# Patient Record
Sex: Female | Born: 1945 | Race: White | Hispanic: No | Smoking: Never smoker
Health system: Southern US, Community
[De-identification: ages and names within clinical notes are randomized; demographics above are authoritative.]

---

## 1977-08-28 HISTORY — PX: TUBAL LIGATION: SHX77

## 2003-02-02 ENCOUNTER — Encounter: Payer: Self-pay | Admitting: Internal Medicine

## 2003-02-02 ENCOUNTER — Inpatient Hospital Stay (HOSPITAL_COMMUNITY): Admission: AD | Admit: 2003-02-02 | Discharge: 2003-02-04 | Payer: Self-pay | Admitting: Internal Medicine

## 2011-07-06 ENCOUNTER — Ambulatory Visit
Admission: RE | Admit: 2011-07-06 | Discharge: 2011-07-06 | Disposition: A | Discharge status: Home / Self Care | Payer: BC Managed Care – PPO | Source: Ambulatory Visit | Attending: Family Medicine | Admitting: Family Medicine

## 2011-07-06 ENCOUNTER — Other Ambulatory Visit: Payer: Self-pay | Admitting: Family Medicine

## 2011-07-06 DIAGNOSIS — R0602 Shortness of breath: Secondary | ICD-10-CM

## 2011-07-18 ENCOUNTER — Ambulatory Visit: Payer: Self-pay | Admitting: Internal Medicine

## 2011-08-04 ENCOUNTER — Encounter: Payer: Self-pay | Admitting: Pulmonary Disease

## 2011-08-07 ENCOUNTER — Ambulatory Visit (INDEPENDENT_AMBULATORY_CARE_PROVIDER_SITE_OTHER): Payer: BC Managed Care – PPO | Admitting: Pulmonary Disease

## 2011-08-07 ENCOUNTER — Encounter: Payer: Self-pay | Admitting: Pulmonary Disease

## 2011-08-07 VITALS — BP 138/86 | HR 81 | Temp 98.1°F | Ht 61.0 in | Wt 173.6 lb

## 2011-08-07 DIAGNOSIS — R0609 Other forms of dyspnea: Secondary | ICD-10-CM | POA: Insufficient documentation

## 2011-08-07 NOTE — Patient Instructions (Signed)
Continue your omeprazole for reflux in am and pm as your are doing, but add zantac 300mg  over the counter each night at bedtime until next visit. Get chlorpheniramine 8mg  over the counter and take each night at bedtime until next visit. Will schedule for breathing studies, and see you back same day for review.

## 2011-08-07 NOTE — Progress Notes (Signed)
  Subjective:    Patient ID: Stefanie Hanson, female    DOB: Jan 25, 1946, 65 y.o.   MRN: [id number]  HPI The patient is a 65 year old female who I've been asked to see for dyspnea on exertion and wheezing.  The patient notes worsening shortness of breath gradually over the last 6-8 months, and now is occurring with almost any activity.  She also notes increased issues with conversations.  The patient describes a 2 block dyspnea on exertion at a moderate pace on flat ground, and we'll also get winded walking up one flight of stairs and bringing groceries in from the car.  The patient states that she makes a strange noise from her throat with breathing.  She sometimes feels there is "something stuck there".  She also describes frequent audible wheezing.  She has a dry cough, but admits to having postnasal drip, and also significant reflux with breakthrough symptoms at night despite being on medications.  The patient has never smoked, and has no history of asthma.  She has no history of throat surgery, or history of prolonged endotracheal intubation.  She has had a recent chest x-ray that was clear.   Review of Systems  Constitutional: Negative for fever and unexpected weight change.  HENT: Positive for congestion. Negative for ear pain, nosebleeds, sore throat, rhinorrhea, sneezing, trouble swallowing, dental problem, postnasal drip and sinus pressure.   Eyes: Negative for redness and itching.  Respiratory: Positive for cough and shortness of breath. Negative for chest tightness and wheezing.   Cardiovascular: Negative for palpitations and leg swelling.  Gastrointestinal: Negative for nausea and vomiting.  Genitourinary: Negative for dysuria.  Musculoskeletal: Negative for joint swelling.  Skin: Negative for rash.  Neurological: Negative for headaches.  Hematological: Does not bruise/bleed easily.  Psychiatric/Behavioral: Negative for dysphoric mood. The patient is not nervous/anxious.          Objective:   Physical Exam Constitutional:  Well developed, no acute distress  HENT:  Nares patent without discharge, but narrowed esp on left  Oropharynx without exudate, palate and uvula are normal  Eyes:  Perrla, eomi, no scleral icterus  Neck:  No JVD, no TMG  Cardiovascular:  Normal rate, regular rhythm, no rubs or gallops.  No murmurs        Intact distal pulses  Pulmonary :  Normal breath sounds, prominent upper airway noise with inspiration and expiration   No rales, rhonchi, or wheezing  Abdominal:  Soft, nondistended, bowel sounds present.  No tenderness noted.   Musculoskeletal:  Minimal lower extremity edema noted, +varicosities  Lymph Nodes:  No cervical lymphadenopathy noted  Skin:  No cyanosis noted  Neurologic:  Alert, appropriate, moves all 4 extremities without obvious deficit.         Assessment & Plan:

## 2011-08-07 NOTE — Assessment & Plan Note (Signed)
The patient clearly has some type of upper airway issue, and I suspect this is the source of her "wheezing".  This may or may not be the cause of her dyspnea on exertion, depending upon whether it interferes with vocal cord excursion.  The most common cause for this type of upper airway issue is reflux disease or postnasal drip.  Would also consider whether she may have an anatomical issue leading to stenosis or vocal cord dysfunction.  I doubt this represents obstructive airways disease.  At this point, would like to treat her more aggressively for postnasal drip and reflux, and also schedule her for a full pulmonary function studies.  This will allow [place] to evaluate her for obstructive airways, but also to look at the flow volume loop to see if there is an anatomic issue.  She may need an ENT evaluation for an upper airway exam.

## 2011-09-01 ENCOUNTER — Ambulatory Visit: Payer: BC Managed Care – PPO | Admitting: Pulmonary Disease

## 2011-09-25 ENCOUNTER — Encounter: Payer: Self-pay | Admitting: Pulmonary Disease

## 2011-09-25 ENCOUNTER — Ambulatory Visit (INDEPENDENT_AMBULATORY_CARE_PROVIDER_SITE_OTHER): Payer: BC Managed Care – PPO | Admitting: Pulmonary Disease

## 2011-09-25 VITALS — BP 124/78 | HR 70 | Temp 98.0°F | Ht 61.0 in | Wt 164.0 lb

## 2011-09-25 DIAGNOSIS — R0609 Other forms of dyspnea: Secondary | ICD-10-CM

## 2011-09-25 LAB — PULMONARY FUNCTION TEST

## 2011-09-25 NOTE — Assessment & Plan Note (Signed)
The patient has a history and exam from last visit that was very suggestive of an upper airway process.  Her PFTs today showed very abnormal airflows, however her flow volume loop clearly suggest an upper airway obstruction.  Both the inspiratory and expiratory limbs were flattened, and suggestive of a fixed airway obstruction.  It is unclear whether this is an anatomic issue, or a functional vocal cord issue.  At this point, I would like to refer her to ENT for an upper airway exam.

## 2011-09-25 NOTE — Progress Notes (Signed)
PFT done today. 

## 2011-09-25 NOTE — Patient Instructions (Signed)
Your breathing tests suggest some type of obstruction in your upper airway.  Will send you to ENT for evaluation. If this is unremarkable, will discuss further options with you.

## 2011-09-25 NOTE — Progress Notes (Signed)
  Subjective:    Patient ID: Stefanie Hanson, female    DOB: 17-Mar-1946, 67 y.o.   MRN: [id number]  HPI Patient comes in today for followup of her pulmonary function studies, ordered as part of a workup for dyspnea.  At the last visit, there were concerns about an upper airway process, and she was treated with an antihistamine and proton pump inhibitor for possible sources of upper airway irritation.  The patient had pulmonary function studies today which showed abnormal flows, however the flow volume loop was very abnormal and suggestive of a fixed airflow obstruction.  I have reviewed this study with her in detail, and answered all of her questions.   Review of Systems  Constitutional: Negative for fever and unexpected weight change.  HENT: Negative for ear pain, nosebleeds, congestion, sore throat, rhinorrhea, sneezing, trouble swallowing, dental problem, postnasal drip and sinus pressure.   Eyes: Negative for redness and itching.  Respiratory: Positive for cough and wheezing. Negative for chest tightness and shortness of breath.   Cardiovascular: Negative for palpitations and leg swelling.  Gastrointestinal: Positive for nausea. Negative for vomiting.  Genitourinary: Negative for dysuria.  Musculoskeletal: Negative for joint swelling.  Skin: Negative for rash.  Neurological: Negative for headaches.  Hematological: Does not bruise/bleed easily.  Psychiatric/Behavioral: Negative for dysphoric mood. The patient is not nervous/anxious.        Objective:   Physical Exam Well-developed female in no acute distress Nose without purulence or discharge noted Chest with totally clear lung fields except for transmitted noise from the upper airway. Cardiac exam with regular rate and rhythm Alert and oriented, moves all 4 extremities.       Assessment & Plan:

## 2011-10-05 ENCOUNTER — Other Ambulatory Visit: Payer: Self-pay | Admitting: Otolaryngology

## 2011-10-05 DIAGNOSIS — R0989 Other specified symptoms and signs involving the circulatory and respiratory systems: Secondary | ICD-10-CM

## 2011-10-05 DIAGNOSIS — R0609 Other forms of dyspnea: Secondary | ICD-10-CM

## 2011-10-05 DIAGNOSIS — R490 Dysphonia: Secondary | ICD-10-CM

## 2011-10-06 ENCOUNTER — Ambulatory Visit
Admission: RE | Admit: 2011-10-06 | Discharge: 2011-10-06 | Disposition: A | Discharge status: Home / Self Care | Payer: BC Managed Care – PPO | Source: Ambulatory Visit | Attending: Otolaryngology | Admitting: Otolaryngology

## 2011-10-06 DIAGNOSIS — R490 Dysphonia: Secondary | ICD-10-CM

## 2011-10-06 DIAGNOSIS — R0989 Other specified symptoms and signs involving the circulatory and respiratory systems: Secondary | ICD-10-CM

## 2011-10-06 MED ORDER — IOHEXOL 300 MG/ML  SOLN: 75.0000 mL | Freq: Once | INTRAMUSCULAR | Status: AC | PRN

## 2012-07-16 DIAGNOSIS — H2510 Age-related nuclear cataract, unspecified eye: Secondary | ICD-10-CM | POA: Diagnosis not present

## 2012-07-16 DIAGNOSIS — H43819 Vitreous degeneration, unspecified eye: Secondary | ICD-10-CM | POA: Diagnosis not present

## 2012-07-16 DIAGNOSIS — H52209 Unspecified astigmatism, unspecified eye: Secondary | ICD-10-CM | POA: Diagnosis not present

## 2012-09-19 DIAGNOSIS — H43819 Vitreous degeneration, unspecified eye: Secondary | ICD-10-CM | POA: Diagnosis not present

## 2013-02-18 DIAGNOSIS — Z0000 Encounter for general adult medical examination without abnormal findings: Secondary | ICD-10-CM | POA: Diagnosis not present

## 2013-02-26 ENCOUNTER — Other Ambulatory Visit: Payer: Self-pay | Admitting: Family Medicine

## 2013-02-26 ENCOUNTER — Ambulatory Visit
Admission: RE | Admit: 2013-02-26 | Discharge: 2013-02-26 | Disposition: A | Discharge status: Home / Self Care | Payer: Medicare Other | Source: Ambulatory Visit | Attending: Family Medicine | Admitting: Family Medicine

## 2013-02-26 DIAGNOSIS — S66802A Unspecified injury of other specified muscles, fascia and tendons at wrist and hand level, left hand, initial encounter: Secondary | ICD-10-CM

## 2013-02-26 DIAGNOSIS — M898X9 Other specified disorders of bone, unspecified site: Secondary | ICD-10-CM

## 2013-02-26 DIAGNOSIS — M7989 Other specified soft tissue disorders: Secondary | ICD-10-CM | POA: Diagnosis not present

## 2013-03-20 DIAGNOSIS — M6740 Ganglion, unspecified site: Secondary | ICD-10-CM | POA: Diagnosis not present

## 2013-06-23 DIAGNOSIS — J386 Stenosis of larynx: Secondary | ICD-10-CM | POA: Diagnosis not present

## 2013-07-17 DIAGNOSIS — K219 Gastro-esophageal reflux disease without esophagitis: Secondary | ICD-10-CM | POA: Diagnosis not present

## 2013-07-17 DIAGNOSIS — J386 Stenosis of larynx: Secondary | ICD-10-CM | POA: Diagnosis not present

## 2013-08-11 DIAGNOSIS — J386 Stenosis of larynx: Secondary | ICD-10-CM | POA: Diagnosis not present

## 2013-08-11 DIAGNOSIS — K219 Gastro-esophageal reflux disease without esophagitis: Secondary | ICD-10-CM | POA: Diagnosis not present

## 2013-09-22 DIAGNOSIS — H43819 Vitreous degeneration, unspecified eye: Secondary | ICD-10-CM | POA: Diagnosis not present

## 2013-09-22 DIAGNOSIS — H40019 Open angle with borderline findings, low risk, unspecified eye: Secondary | ICD-10-CM | POA: Diagnosis not present

## 2013-09-22 DIAGNOSIS — H18519 Endothelial corneal dystrophy, unspecified eye: Secondary | ICD-10-CM | POA: Diagnosis not present

## 2013-09-22 DIAGNOSIS — H2510 Age-related nuclear cataract, unspecified eye: Secondary | ICD-10-CM | POA: Diagnosis not present

## 2014-07-28 DIAGNOSIS — Z1211 Encounter for screening for malignant neoplasm of colon: Secondary | ICD-10-CM | POA: Diagnosis not present

## 2014-07-28 DIAGNOSIS — Z0000 Encounter for general adult medical examination without abnormal findings: Secondary | ICD-10-CM | POA: Diagnosis not present

## 2014-10-23 DIAGNOSIS — H0012 Chalazion right lower eyelid: Secondary | ICD-10-CM | POA: Diagnosis not present

## 2014-10-23 DIAGNOSIS — H2513 Age-related nuclear cataract, bilateral: Secondary | ICD-10-CM | POA: Diagnosis not present

## 2014-10-23 DIAGNOSIS — H04123 Dry eye syndrome of bilateral lacrimal glands: Secondary | ICD-10-CM | POA: Diagnosis not present

## 2014-10-23 DIAGNOSIS — H43813 Vitreous degeneration, bilateral: Secondary | ICD-10-CM | POA: Diagnosis not present

## 2014-12-28 DIAGNOSIS — J398 Other specified diseases of upper respiratory tract: Secondary | ICD-10-CM | POA: Diagnosis not present

## 2014-12-28 DIAGNOSIS — Z9889 Other specified postprocedural states: Secondary | ICD-10-CM | POA: Diagnosis not present

## 2014-12-28 DIAGNOSIS — J383 Other diseases of vocal cords: Secondary | ICD-10-CM | POA: Diagnosis not present

## 2014-12-28 DIAGNOSIS — J386 Stenosis of larynx: Secondary | ICD-10-CM | POA: Diagnosis not present

## 2015-02-04 DIAGNOSIS — J398 Other specified diseases of upper respiratory tract: Secondary | ICD-10-CM | POA: Diagnosis not present

## 2015-02-04 DIAGNOSIS — K219 Gastro-esophageal reflux disease without esophagitis: Secondary | ICD-10-CM | POA: Diagnosis not present

## 2015-02-04 DIAGNOSIS — J386 Stenosis of larynx: Secondary | ICD-10-CM | POA: Diagnosis not present

## 2015-03-08 DIAGNOSIS — J383 Other diseases of vocal cords: Secondary | ICD-10-CM | POA: Diagnosis not present

## 2015-03-08 DIAGNOSIS — J384 Edema of larynx: Secondary | ICD-10-CM | POA: Diagnosis not present

## 2015-03-08 DIAGNOSIS — Z09 Encounter for follow-up examination after completed treatment for conditions other than malignant neoplasm: Secondary | ICD-10-CM | POA: Diagnosis not present

## 2015-03-08 DIAGNOSIS — R490 Dysphonia: Secondary | ICD-10-CM | POA: Diagnosis not present

## 2015-03-08 DIAGNOSIS — Z8709 Personal history of other diseases of the respiratory system: Secondary | ICD-10-CM | POA: Diagnosis not present

## 2015-03-12 DIAGNOSIS — J386 Stenosis of larynx: Secondary | ICD-10-CM | POA: Diagnosis not present

## 2015-05-19 DIAGNOSIS — Z13220 Encounter for screening for lipoid disorders: Secondary | ICD-10-CM | POA: Diagnosis not present

## 2015-05-19 DIAGNOSIS — Z136 Encounter for screening for cardiovascular disorders: Secondary | ICD-10-CM | POA: Diagnosis not present

## 2015-06-07 DIAGNOSIS — Z9889 Other specified postprocedural states: Secondary | ICD-10-CM | POA: Diagnosis not present

## 2015-06-07 DIAGNOSIS — J386 Stenosis of larynx: Secondary | ICD-10-CM | POA: Diagnosis not present

## 2015-06-07 DIAGNOSIS — R490 Dysphonia: Secondary | ICD-10-CM | POA: Diagnosis not present

## 2015-09-08 DIAGNOSIS — J386 Stenosis of larynx: Secondary | ICD-10-CM | POA: Diagnosis not present

## 2015-09-13 DIAGNOSIS — E785 Hyperlipidemia, unspecified: Secondary | ICD-10-CM | POA: Diagnosis not present

## 2015-10-26 DIAGNOSIS — H52203 Unspecified astigmatism, bilateral: Secondary | ICD-10-CM | POA: Diagnosis not present

## 2015-10-26 DIAGNOSIS — H43813 Vitreous degeneration, bilateral: Secondary | ICD-10-CM | POA: Diagnosis not present

## 2015-10-26 DIAGNOSIS — H2513 Age-related nuclear cataract, bilateral: Secondary | ICD-10-CM | POA: Diagnosis not present

## 2015-10-26 DIAGNOSIS — H524 Presbyopia: Secondary | ICD-10-CM | POA: Diagnosis not present

## 2016-04-04 DIAGNOSIS — L538 Other specified erythematous conditions: Secondary | ICD-10-CM | POA: Diagnosis not present

## 2016-04-04 DIAGNOSIS — J0410 Acute tracheitis without obstruction: Secondary | ICD-10-CM | POA: Diagnosis not present

## 2016-04-04 DIAGNOSIS — K219 Gastro-esophageal reflux disease without esophagitis: Secondary | ICD-10-CM | POA: Diagnosis not present

## 2016-04-04 DIAGNOSIS — J384 Edema of larynx: Secondary | ICD-10-CM | POA: Diagnosis not present

## 2016-04-04 DIAGNOSIS — J386 Stenosis of larynx: Secondary | ICD-10-CM | POA: Diagnosis not present

## 2016-04-04 DIAGNOSIS — J383 Other diseases of vocal cords: Secondary | ICD-10-CM | POA: Diagnosis not present

## 2016-04-04 DIAGNOSIS — R490 Dysphonia: Secondary | ICD-10-CM | POA: Diagnosis not present

## 2016-04-04 DIAGNOSIS — J398 Other specified diseases of upper respiratory tract: Secondary | ICD-10-CM | POA: Diagnosis not present

## 2016-05-04 ENCOUNTER — Other Ambulatory Visit: Payer: Self-pay

## 2016-07-06 DIAGNOSIS — K219 Gastro-esophageal reflux disease without esophagitis: Secondary | ICD-10-CM | POA: Diagnosis not present

## 2016-07-06 DIAGNOSIS — Z9851 Tubal ligation status: Secondary | ICD-10-CM | POA: Diagnosis not present

## 2016-07-06 DIAGNOSIS — J386 Stenosis of larynx: Secondary | ICD-10-CM | POA: Diagnosis not present

## 2016-10-25 DIAGNOSIS — Z1159 Encounter for screening for other viral diseases: Secondary | ICD-10-CM | POA: Diagnosis not present

## 2016-10-25 DIAGNOSIS — E785 Hyperlipidemia, unspecified: Secondary | ICD-10-CM | POA: Diagnosis not present

## 2016-10-25 DIAGNOSIS — Z0000 Encounter for general adult medical examination without abnormal findings: Secondary | ICD-10-CM | POA: Diagnosis not present

## 2016-10-27 DIAGNOSIS — H524 Presbyopia: Secondary | ICD-10-CM | POA: Diagnosis not present

## 2016-10-27 DIAGNOSIS — H2513 Age-related nuclear cataract, bilateral: Secondary | ICD-10-CM | POA: Diagnosis not present

## 2016-10-27 DIAGNOSIS — H01001 Unspecified blepharitis right upper eyelid: Secondary | ICD-10-CM | POA: Diagnosis not present

## 2016-10-27 DIAGNOSIS — H43813 Vitreous degeneration, bilateral: Secondary | ICD-10-CM | POA: Diagnosis not present

## 2017-02-06 DIAGNOSIS — K219 Gastro-esophageal reflux disease without esophagitis: Secondary | ICD-10-CM | POA: Diagnosis not present

## 2017-02-06 DIAGNOSIS — Z6834 Body mass index (BMI) 34.0-34.9, adult: Secondary | ICD-10-CM | POA: Diagnosis not present

## 2017-09-20 DIAGNOSIS — Z79899 Other long term (current) drug therapy: Secondary | ICD-10-CM | POA: Diagnosis not present

## 2017-09-20 DIAGNOSIS — J386 Stenosis of larynx: Secondary | ICD-10-CM | POA: Diagnosis not present

## 2017-09-20 DIAGNOSIS — K219 Gastro-esophageal reflux disease without esophagitis: Secondary | ICD-10-CM | POA: Diagnosis not present

## 2017-09-20 DIAGNOSIS — R0602 Shortness of breath: Secondary | ICD-10-CM | POA: Diagnosis not present

## 2017-09-20 DIAGNOSIS — Z9189 Other specified personal risk factors, not elsewhere classified: Secondary | ICD-10-CM | POA: Diagnosis not present

## 2017-09-20 DIAGNOSIS — J398 Other specified diseases of upper respiratory tract: Secondary | ICD-10-CM | POA: Diagnosis not present

## 2017-09-20 DIAGNOSIS — Z9889 Other specified postprocedural states: Secondary | ICD-10-CM | POA: Diagnosis not present

## 2017-10-23 DIAGNOSIS — J386 Stenosis of larynx: Secondary | ICD-10-CM | POA: Diagnosis not present

## 2017-10-23 DIAGNOSIS — J398 Other specified diseases of upper respiratory tract: Secondary | ICD-10-CM | POA: Diagnosis not present

## 2017-10-23 DIAGNOSIS — R490 Dysphonia: Secondary | ICD-10-CM | POA: Diagnosis not present

## 2017-10-26 DIAGNOSIS — Z1159 Encounter for screening for other viral diseases: Secondary | ICD-10-CM | POA: Diagnosis not present

## 2017-10-26 DIAGNOSIS — Z1211 Encounter for screening for malignant neoplasm of colon: Secondary | ICD-10-CM | POA: Diagnosis not present

## 2017-10-26 DIAGNOSIS — E785 Hyperlipidemia, unspecified: Secondary | ICD-10-CM | POA: Diagnosis not present

## 2017-10-26 DIAGNOSIS — Z0000 Encounter for general adult medical examination without abnormal findings: Secondary | ICD-10-CM | POA: Diagnosis not present

## 2017-10-26 DIAGNOSIS — L309 Dermatitis, unspecified: Secondary | ICD-10-CM | POA: Diagnosis not present

## 2017-10-29 DIAGNOSIS — H04123 Dry eye syndrome of bilateral lacrimal glands: Secondary | ICD-10-CM | POA: Diagnosis not present

## 2017-10-29 DIAGNOSIS — H52203 Unspecified astigmatism, bilateral: Secondary | ICD-10-CM | POA: Diagnosis not present

## 2017-10-29 DIAGNOSIS — H43813 Vitreous degeneration, bilateral: Secondary | ICD-10-CM | POA: Diagnosis not present

## 2017-10-29 DIAGNOSIS — H2513 Age-related nuclear cataract, bilateral: Secondary | ICD-10-CM | POA: Diagnosis not present

## 2018-02-13 DIAGNOSIS — L218 Other seborrheic dermatitis: Secondary | ICD-10-CM | POA: Diagnosis not present

## 2018-02-13 DIAGNOSIS — L84 Corns and callosities: Secondary | ICD-10-CM | POA: Diagnosis not present

## 2018-02-13 DIAGNOSIS — D485 Neoplasm of uncertain behavior of skin: Secondary | ICD-10-CM | POA: Diagnosis not present

## 2018-02-13 DIAGNOSIS — L821 Other seborrheic keratosis: Secondary | ICD-10-CM | POA: Diagnosis not present

## 2018-02-13 DIAGNOSIS — L989 Disorder of the skin and subcutaneous tissue, unspecified: Secondary | ICD-10-CM | POA: Diagnosis not present

## 2018-05-03 DIAGNOSIS — J386 Stenosis of larynx: Secondary | ICD-10-CM | POA: Diagnosis not present

## 2018-05-03 DIAGNOSIS — R634 Abnormal weight loss: Secondary | ICD-10-CM | POA: Diagnosis not present

## 2018-05-03 DIAGNOSIS — E785 Hyperlipidemia, unspecified: Secondary | ICD-10-CM | POA: Diagnosis not present

## 2018-09-11 DIAGNOSIS — J386 Stenosis of larynx: Secondary | ICD-10-CM | POA: Diagnosis not present

## 2018-09-11 DIAGNOSIS — F430 Acute stress reaction: Secondary | ICD-10-CM | POA: Diagnosis not present

## 2018-10-03 DIAGNOSIS — J398 Other specified diseases of upper respiratory tract: Secondary | ICD-10-CM | POA: Diagnosis not present

## 2018-10-03 DIAGNOSIS — J386 Stenosis of larynx: Secondary | ICD-10-CM | POA: Diagnosis not present

## 2018-10-30 DIAGNOSIS — E785 Hyperlipidemia, unspecified: Secondary | ICD-10-CM | POA: Diagnosis not present

## 2018-10-30 DIAGNOSIS — Z0000 Encounter for general adult medical examination without abnormal findings: Secondary | ICD-10-CM | POA: Diagnosis not present

## 2018-10-30 DIAGNOSIS — J386 Stenosis of larynx: Secondary | ICD-10-CM | POA: Diagnosis not present

## 2018-10-30 DIAGNOSIS — H9190 Unspecified hearing loss, unspecified ear: Secondary | ICD-10-CM | POA: Diagnosis not present

## 2018-11-01 DIAGNOSIS — H2513 Age-related nuclear cataract, bilateral: Secondary | ICD-10-CM | POA: Diagnosis not present

## 2018-11-01 DIAGNOSIS — H52203 Unspecified astigmatism, bilateral: Secondary | ICD-10-CM | POA: Diagnosis not present

## 2018-11-01 DIAGNOSIS — H1851 Endothelial corneal dystrophy: Secondary | ICD-10-CM | POA: Diagnosis not present

## 2018-11-01 DIAGNOSIS — H43813 Vitreous degeneration, bilateral: Secondary | ICD-10-CM | POA: Diagnosis not present

## 2019-03-31 DIAGNOSIS — H903 Sensorineural hearing loss, bilateral: Secondary | ICD-10-CM | POA: Diagnosis not present

## 2019-03-31 DIAGNOSIS — H9313 Tinnitus, bilateral: Secondary | ICD-10-CM | POA: Diagnosis not present

## 2019-04-01 DIAGNOSIS — L659 Nonscarring hair loss, unspecified: Secondary | ICD-10-CM | POA: Diagnosis not present

## 2019-04-01 DIAGNOSIS — E785 Hyperlipidemia, unspecified: Secondary | ICD-10-CM | POA: Diagnosis not present

## 2019-04-01 DIAGNOSIS — F439 Reaction to severe stress, unspecified: Secondary | ICD-10-CM | POA: Diagnosis not present

## 2019-04-14 DIAGNOSIS — J398 Other specified diseases of upper respiratory tract: Secondary | ICD-10-CM | POA: Diagnosis not present

## 2019-04-14 DIAGNOSIS — J386 Stenosis of larynx: Secondary | ICD-10-CM | POA: Diagnosis not present

## 2019-04-28 DIAGNOSIS — F411 Generalized anxiety disorder: Secondary | ICD-10-CM | POA: Diagnosis not present

## 2019-04-28 DIAGNOSIS — F331 Major depressive disorder, recurrent, moderate: Secondary | ICD-10-CM | POA: Diagnosis not present

## 2019-05-08 DIAGNOSIS — F331 Major depressive disorder, recurrent, moderate: Secondary | ICD-10-CM | POA: Diagnosis not present

## 2019-05-08 DIAGNOSIS — F411 Generalized anxiety disorder: Secondary | ICD-10-CM | POA: Diagnosis not present

## 2019-05-22 DIAGNOSIS — F411 Generalized anxiety disorder: Secondary | ICD-10-CM | POA: Diagnosis not present

## 2019-05-22 DIAGNOSIS — F331 Major depressive disorder, recurrent, moderate: Secondary | ICD-10-CM | POA: Diagnosis not present

## 2019-05-26 DIAGNOSIS — Z9889 Other specified postprocedural states: Secondary | ICD-10-CM | POA: Diagnosis not present

## 2019-05-26 DIAGNOSIS — J386 Stenosis of larynx: Secondary | ICD-10-CM | POA: Diagnosis not present

## 2019-06-26 DIAGNOSIS — F331 Major depressive disorder, recurrent, moderate: Secondary | ICD-10-CM | POA: Diagnosis not present

## 2019-06-26 DIAGNOSIS — F411 Generalized anxiety disorder: Secondary | ICD-10-CM | POA: Diagnosis not present

## 2019-09-01 DIAGNOSIS — J398 Other specified diseases of upper respiratory tract: Secondary | ICD-10-CM | POA: Diagnosis not present

## 2019-09-01 DIAGNOSIS — J386 Stenosis of larynx: Secondary | ICD-10-CM | POA: Diagnosis not present

## 2019-10-23 DIAGNOSIS — F411 Generalized anxiety disorder: Secondary | ICD-10-CM | POA: Diagnosis not present

## 2019-10-23 DIAGNOSIS — F331 Major depressive disorder, recurrent, moderate: Secondary | ICD-10-CM | POA: Diagnosis not present

## 2019-11-04 DIAGNOSIS — Z1211 Encounter for screening for malignant neoplasm of colon: Secondary | ICD-10-CM | POA: Diagnosis not present

## 2019-11-04 DIAGNOSIS — H9190 Unspecified hearing loss, unspecified ear: Secondary | ICD-10-CM | POA: Diagnosis not present

## 2019-11-04 DIAGNOSIS — J386 Stenosis of larynx: Secondary | ICD-10-CM | POA: Diagnosis not present

## 2019-11-04 DIAGNOSIS — Z0000 Encounter for general adult medical examination without abnormal findings: Secondary | ICD-10-CM | POA: Diagnosis not present

## 2019-11-04 DIAGNOSIS — E785 Hyperlipidemia, unspecified: Secondary | ICD-10-CM | POA: Diagnosis not present

## 2019-11-07 DIAGNOSIS — Z1211 Encounter for screening for malignant neoplasm of colon: Secondary | ICD-10-CM | POA: Diagnosis not present

## 2019-11-13 DIAGNOSIS — F331 Major depressive disorder, recurrent, moderate: Secondary | ICD-10-CM | POA: Diagnosis not present

## 2019-11-13 DIAGNOSIS — F411 Generalized anxiety disorder: Secondary | ICD-10-CM | POA: Diagnosis not present

## 2019-11-27 DIAGNOSIS — F411 Generalized anxiety disorder: Secondary | ICD-10-CM | POA: Diagnosis not present

## 2019-11-27 DIAGNOSIS — F331 Major depressive disorder, recurrent, moderate: Secondary | ICD-10-CM | POA: Diagnosis not present

## 2019-12-10 DIAGNOSIS — F331 Major depressive disorder, recurrent, moderate: Secondary | ICD-10-CM | POA: Diagnosis not present

## 2019-12-10 DIAGNOSIS — F411 Generalized anxiety disorder: Secondary | ICD-10-CM | POA: Diagnosis not present

## 2019-12-17 DIAGNOSIS — H524 Presbyopia: Secondary | ICD-10-CM | POA: Diagnosis not present

## 2019-12-17 DIAGNOSIS — H2513 Age-related nuclear cataract, bilateral: Secondary | ICD-10-CM | POA: Diagnosis not present

## 2019-12-17 DIAGNOSIS — H18513 Endothelial corneal dystrophy, bilateral: Secondary | ICD-10-CM | POA: Diagnosis not present

## 2019-12-17 DIAGNOSIS — H52203 Unspecified astigmatism, bilateral: Secondary | ICD-10-CM | POA: Diagnosis not present

## 2019-12-24 DIAGNOSIS — F411 Generalized anxiety disorder: Secondary | ICD-10-CM | POA: Diagnosis not present

## 2019-12-24 DIAGNOSIS — F331 Major depressive disorder, recurrent, moderate: Secondary | ICD-10-CM | POA: Diagnosis not present

## 2019-12-29 DIAGNOSIS — J386 Stenosis of larynx: Secondary | ICD-10-CM | POA: Diagnosis not present

## 2020-01-07 DIAGNOSIS — F411 Generalized anxiety disorder: Secondary | ICD-10-CM | POA: Diagnosis not present

## 2020-01-07 DIAGNOSIS — F331 Major depressive disorder, recurrent, moderate: Secondary | ICD-10-CM | POA: Diagnosis not present

## 2020-01-22 DIAGNOSIS — F331 Major depressive disorder, recurrent, moderate: Secondary | ICD-10-CM | POA: Diagnosis not present

## 2020-01-22 DIAGNOSIS — F411 Generalized anxiety disorder: Secondary | ICD-10-CM | POA: Diagnosis not present

## 2020-02-04 DIAGNOSIS — E785 Hyperlipidemia, unspecified: Secondary | ICD-10-CM | POA: Diagnosis not present

## 2020-02-05 DIAGNOSIS — F331 Major depressive disorder, recurrent, moderate: Secondary | ICD-10-CM | POA: Diagnosis not present

## 2020-02-05 DIAGNOSIS — F411 Generalized anxiety disorder: Secondary | ICD-10-CM | POA: Diagnosis not present

## 2020-03-18 DIAGNOSIS — F331 Major depressive disorder, recurrent, moderate: Secondary | ICD-10-CM | POA: Diagnosis not present

## 2020-03-18 DIAGNOSIS — F411 Generalized anxiety disorder: Secondary | ICD-10-CM | POA: Diagnosis not present

## 2020-04-01 DIAGNOSIS — F411 Generalized anxiety disorder: Secondary | ICD-10-CM | POA: Diagnosis not present

## 2020-04-01 DIAGNOSIS — F331 Major depressive disorder, recurrent, moderate: Secondary | ICD-10-CM | POA: Diagnosis not present

## 2020-04-15 DIAGNOSIS — F331 Major depressive disorder, recurrent, moderate: Secondary | ICD-10-CM | POA: Diagnosis not present

## 2020-04-15 DIAGNOSIS — F411 Generalized anxiety disorder: Secondary | ICD-10-CM | POA: Diagnosis not present

## 2020-04-26 DIAGNOSIS — J386 Stenosis of larynx: Secondary | ICD-10-CM | POA: Diagnosis not present

## 2020-04-26 DIAGNOSIS — J398 Other specified diseases of upper respiratory tract: Secondary | ICD-10-CM | POA: Diagnosis not present

## 2020-04-29 DIAGNOSIS — F331 Major depressive disorder, recurrent, moderate: Secondary | ICD-10-CM | POA: Diagnosis not present

## 2020-04-29 DIAGNOSIS — F411 Generalized anxiety disorder: Secondary | ICD-10-CM | POA: Diagnosis not present

## 2020-05-13 DIAGNOSIS — F411 Generalized anxiety disorder: Secondary | ICD-10-CM | POA: Diagnosis not present

## 2020-05-13 DIAGNOSIS — F331 Major depressive disorder, recurrent, moderate: Secondary | ICD-10-CM | POA: Diagnosis not present

## 2020-05-27 DIAGNOSIS — F331 Major depressive disorder, recurrent, moderate: Secondary | ICD-10-CM | POA: Diagnosis not present

## 2020-05-27 DIAGNOSIS — F411 Generalized anxiety disorder: Secondary | ICD-10-CM | POA: Diagnosis not present

## 2020-06-10 DIAGNOSIS — F411 Generalized anxiety disorder: Secondary | ICD-10-CM | POA: Diagnosis not present

## 2020-06-10 DIAGNOSIS — F331 Major depressive disorder, recurrent, moderate: Secondary | ICD-10-CM | POA: Diagnosis not present

## 2020-07-01 DIAGNOSIS — F331 Major depressive disorder, recurrent, moderate: Secondary | ICD-10-CM | POA: Diagnosis not present

## 2020-07-01 DIAGNOSIS — F411 Generalized anxiety disorder: Secondary | ICD-10-CM | POA: Diagnosis not present

## 2020-07-15 DIAGNOSIS — F331 Major depressive disorder, recurrent, moderate: Secondary | ICD-10-CM | POA: Diagnosis not present

## 2020-07-15 DIAGNOSIS — F411 Generalized anxiety disorder: Secondary | ICD-10-CM | POA: Diagnosis not present

## 2020-07-29 DIAGNOSIS — F331 Major depressive disorder, recurrent, moderate: Secondary | ICD-10-CM | POA: Diagnosis not present

## 2020-07-29 DIAGNOSIS — F411 Generalized anxiety disorder: Secondary | ICD-10-CM | POA: Diagnosis not present

## 2020-08-04 DIAGNOSIS — Z23 Encounter for immunization: Secondary | ICD-10-CM | POA: Diagnosis not present

## 2020-09-14 ENCOUNTER — Other Ambulatory Visit: Payer: Self-pay | Admitting: Family Medicine

## 2020-09-14 DIAGNOSIS — M7989 Other specified soft tissue disorders: Secondary | ICD-10-CM

## 2020-10-06 DIAGNOSIS — J386 Stenosis of larynx: Secondary | ICD-10-CM | POA: Diagnosis not present

## 2020-10-06 DIAGNOSIS — J398 Other specified diseases of upper respiratory tract: Secondary | ICD-10-CM | POA: Diagnosis not present

## 2020-10-07 DIAGNOSIS — F331 Major depressive disorder, recurrent, moderate: Secondary | ICD-10-CM | POA: Diagnosis not present

## 2020-10-07 DIAGNOSIS — F411 Generalized anxiety disorder: Secondary | ICD-10-CM | POA: Diagnosis not present

## 2020-10-28 ENCOUNTER — Ambulatory Visit
Admission: RE | Admit: 2020-10-28 | Discharge: 2020-10-28 | Disposition: A | Discharge status: Home / Self Care | Payer: Medicare Other | Source: Ambulatory Visit | Attending: Family Medicine | Admitting: Family Medicine

## 2020-10-28 DIAGNOSIS — F411 Generalized anxiety disorder: Secondary | ICD-10-CM | POA: Diagnosis not present

## 2020-10-28 DIAGNOSIS — F331 Major depressive disorder, recurrent, moderate: Secondary | ICD-10-CM | POA: Diagnosis not present

## 2020-10-28 DIAGNOSIS — M7989 Other specified soft tissue disorders: Secondary | ICD-10-CM

## 2020-11-18 DIAGNOSIS — F411 Generalized anxiety disorder: Secondary | ICD-10-CM | POA: Diagnosis not present

## 2020-11-18 DIAGNOSIS — F331 Major depressive disorder, recurrent, moderate: Secondary | ICD-10-CM | POA: Diagnosis not present

## 2020-12-02 DIAGNOSIS — H9193 Unspecified hearing loss, bilateral: Secondary | ICD-10-CM | POA: Diagnosis not present

## 2020-12-02 DIAGNOSIS — M25562 Pain in left knee: Secondary | ICD-10-CM | POA: Diagnosis not present

## 2020-12-02 DIAGNOSIS — Z0000 Encounter for general adult medical examination without abnormal findings: Secondary | ICD-10-CM | POA: Diagnosis not present

## 2020-12-02 DIAGNOSIS — E785 Hyperlipidemia, unspecified: Secondary | ICD-10-CM | POA: Diagnosis not present

## 2020-12-02 DIAGNOSIS — F320 Major depressive disorder, single episode, mild: Secondary | ICD-10-CM | POA: Diagnosis not present

## 2020-12-02 DIAGNOSIS — J386 Stenosis of larynx: Secondary | ICD-10-CM | POA: Diagnosis not present

## 2020-12-09 DIAGNOSIS — F331 Major depressive disorder, recurrent, moderate: Secondary | ICD-10-CM | POA: Diagnosis not present

## 2020-12-09 DIAGNOSIS — F411 Generalized anxiety disorder: Secondary | ICD-10-CM | POA: Diagnosis not present

## 2020-12-22 DIAGNOSIS — H524 Presbyopia: Secondary | ICD-10-CM | POA: Diagnosis not present

## 2020-12-22 DIAGNOSIS — H2513 Age-related nuclear cataract, bilateral: Secondary | ICD-10-CM | POA: Diagnosis not present

## 2020-12-22 DIAGNOSIS — H18513 Endothelial corneal dystrophy, bilateral: Secondary | ICD-10-CM | POA: Diagnosis not present

## 2020-12-22 DIAGNOSIS — H25013 Cortical age-related cataract, bilateral: Secondary | ICD-10-CM | POA: Diagnosis not present

## 2020-12-30 DIAGNOSIS — F411 Generalized anxiety disorder: Secondary | ICD-10-CM | POA: Diagnosis not present

## 2020-12-30 DIAGNOSIS — F331 Major depressive disorder, recurrent, moderate: Secondary | ICD-10-CM | POA: Diagnosis not present

## 2021-01-06 DIAGNOSIS — J398 Other specified diseases of upper respiratory tract: Secondary | ICD-10-CM | POA: Diagnosis not present

## 2021-01-20 DIAGNOSIS — F331 Major depressive disorder, recurrent, moderate: Secondary | ICD-10-CM | POA: Diagnosis not present

## 2021-01-20 DIAGNOSIS — F411 Generalized anxiety disorder: Secondary | ICD-10-CM | POA: Diagnosis not present

## 2021-02-01 DIAGNOSIS — J386 Stenosis of larynx: Secondary | ICD-10-CM | POA: Diagnosis not present

## 2021-02-02 DIAGNOSIS — F411 Generalized anxiety disorder: Secondary | ICD-10-CM | POA: Diagnosis not present

## 2021-02-02 DIAGNOSIS — F331 Major depressive disorder, recurrent, moderate: Secondary | ICD-10-CM | POA: Diagnosis not present

## 2021-02-24 DIAGNOSIS — F411 Generalized anxiety disorder: Secondary | ICD-10-CM | POA: Diagnosis not present

## 2021-02-24 DIAGNOSIS — F331 Major depressive disorder, recurrent, moderate: Secondary | ICD-10-CM | POA: Diagnosis not present

## 2021-03-17 DIAGNOSIS — F411 Generalized anxiety disorder: Secondary | ICD-10-CM | POA: Diagnosis not present

## 2021-03-17 DIAGNOSIS — F331 Major depressive disorder, recurrent, moderate: Secondary | ICD-10-CM | POA: Diagnosis not present

## 2021-03-21 DIAGNOSIS — J398 Other specified diseases of upper respiratory tract: Secondary | ICD-10-CM | POA: Diagnosis not present

## 2021-03-21 DIAGNOSIS — J386 Stenosis of larynx: Secondary | ICD-10-CM | POA: Diagnosis not present

## 2021-04-07 DIAGNOSIS — F331 Major depressive disorder, recurrent, moderate: Secondary | ICD-10-CM | POA: Diagnosis not present

## 2021-04-07 DIAGNOSIS — F411 Generalized anxiety disorder: Secondary | ICD-10-CM | POA: Diagnosis not present

## 2021-04-28 DIAGNOSIS — F411 Generalized anxiety disorder: Secondary | ICD-10-CM | POA: Diagnosis not present

## 2021-04-28 DIAGNOSIS — F331 Major depressive disorder, recurrent, moderate: Secondary | ICD-10-CM | POA: Diagnosis not present

## 2021-05-04 DIAGNOSIS — J386 Stenosis of larynx: Secondary | ICD-10-CM | POA: Diagnosis not present

## 2021-05-04 DIAGNOSIS — J398 Other specified diseases of upper respiratory tract: Secondary | ICD-10-CM | POA: Diagnosis not present

## 2021-05-19 DIAGNOSIS — F411 Generalized anxiety disorder: Secondary | ICD-10-CM | POA: Diagnosis not present

## 2021-05-19 DIAGNOSIS — F331 Major depressive disorder, recurrent, moderate: Secondary | ICD-10-CM | POA: Diagnosis not present

## 2021-06-23 DIAGNOSIS — F331 Major depressive disorder, recurrent, moderate: Secondary | ICD-10-CM | POA: Diagnosis not present

## 2021-06-23 DIAGNOSIS — F411 Generalized anxiety disorder: Secondary | ICD-10-CM | POA: Diagnosis not present

## 2021-07-04 DIAGNOSIS — J386 Stenosis of larynx: Secondary | ICD-10-CM | POA: Diagnosis not present

## 2021-09-15 IMAGING — US US EXTREM LOW*L* LIMITED
1 series · 14 of 15 positions shown · non-contrast
Comparison: None.

CLINICAL DATA: Leg swelling

EXAM:
ULTRASOUND left LOWER EXTREMITY LIMITED
TECHNIQUE: Ultrasound examination of the lower extremity soft tissues was
performed in the area of clinical concern.

[Series 1: us extrem low*left* limited · 0.07mm/px · 15 acquisitions, 14 frames shown]
[im 1/15]
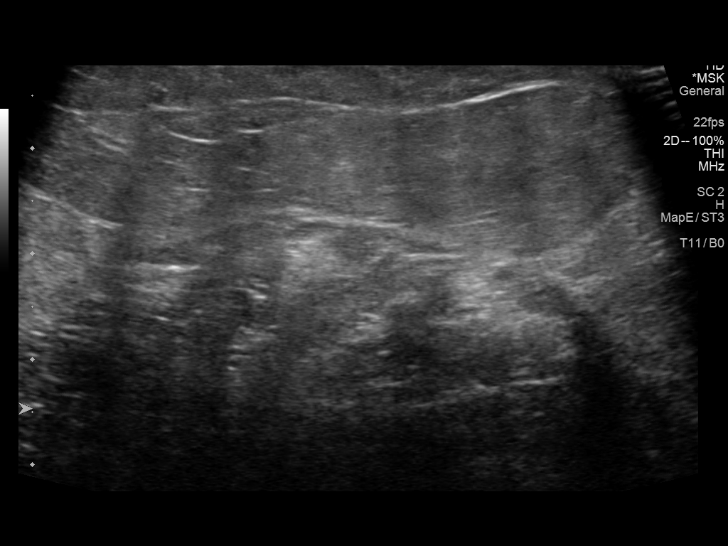
[im 2/15]
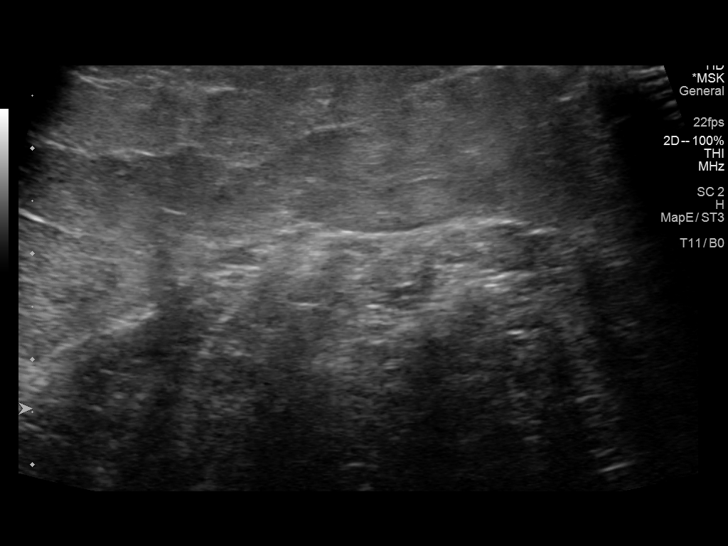
[im 3/15]
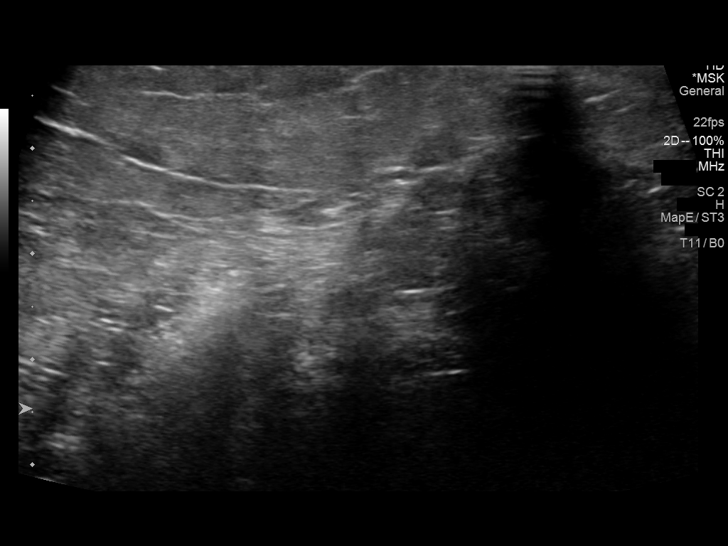
[im 4/15]
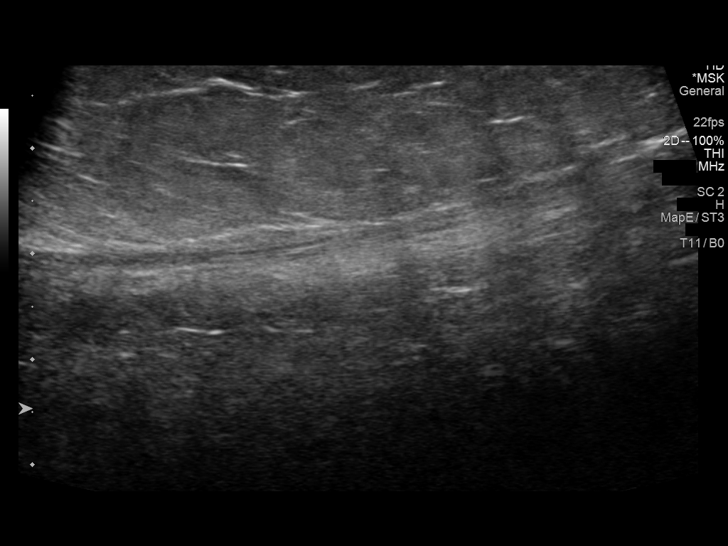
[im 5/15]
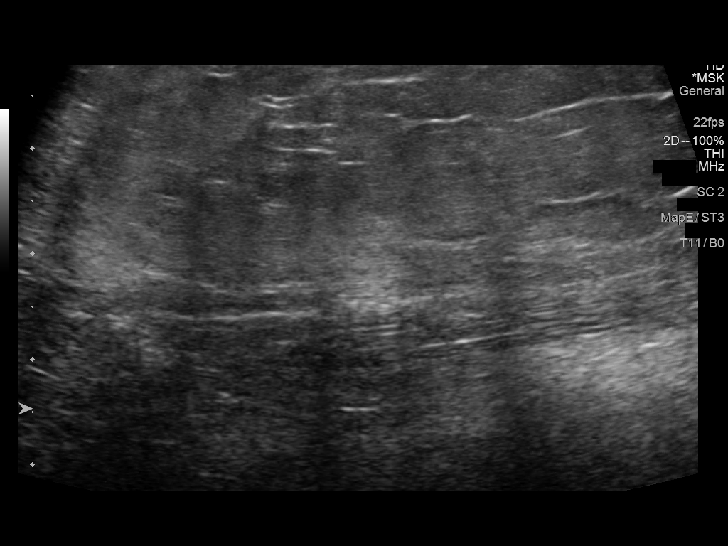
[im 6/15]
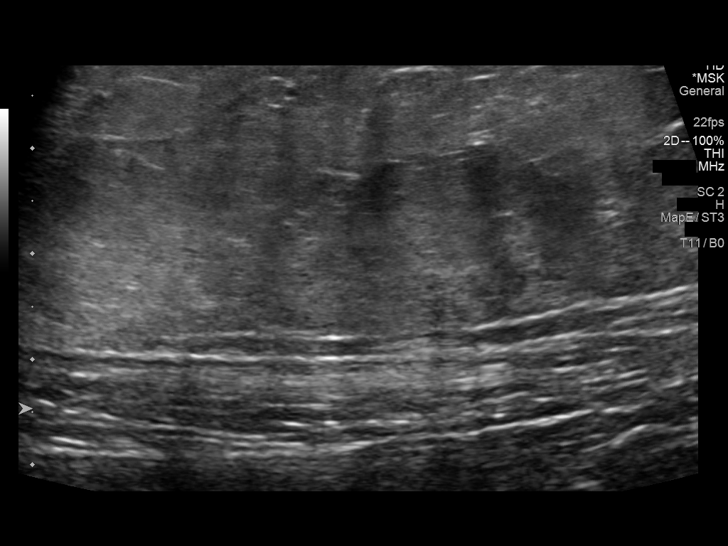
[im 7/15]
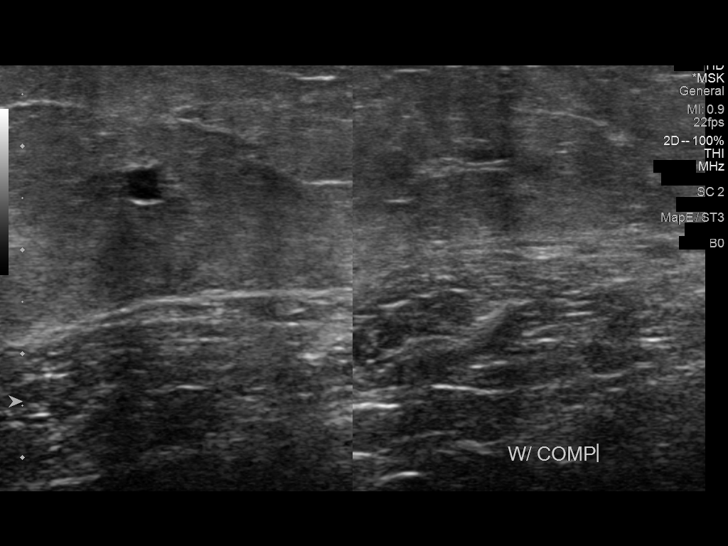
[im 9/15]
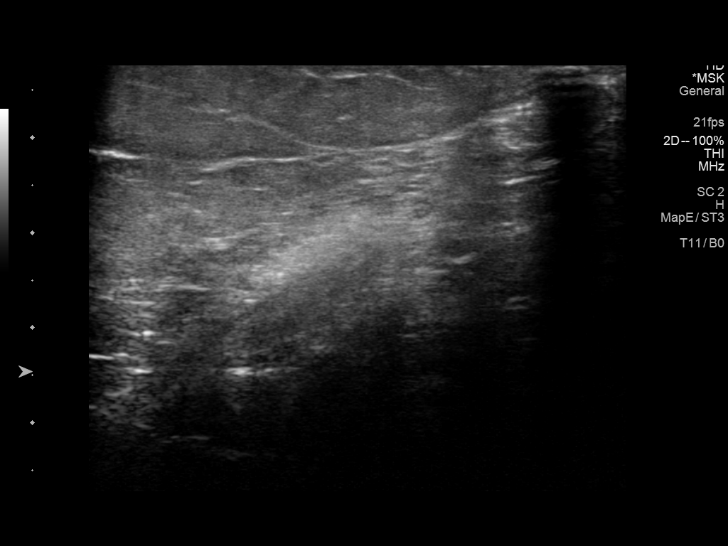
[im 10/15]
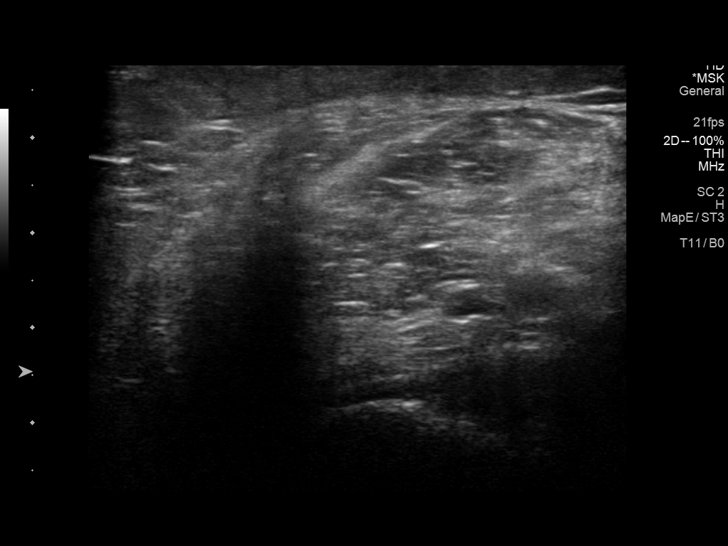
[im 11/15]
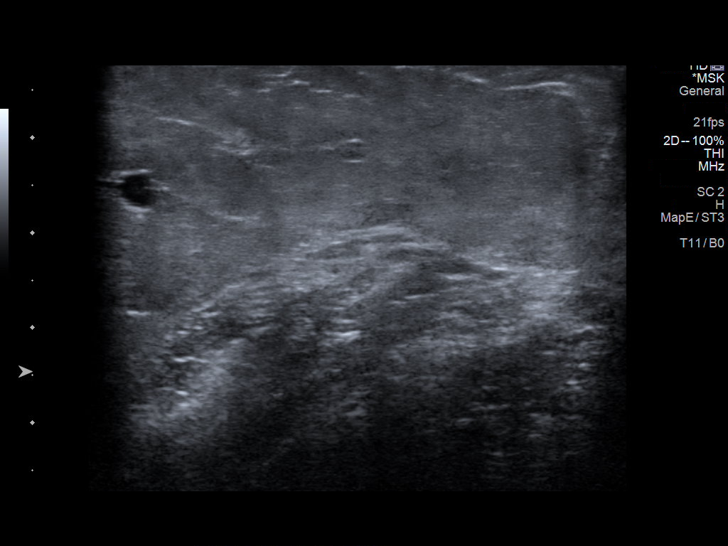
[im 12/15]
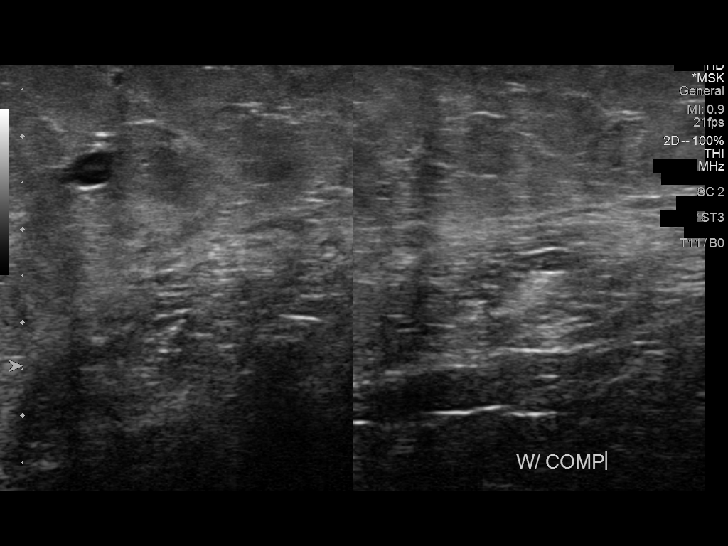
[im 13/15]
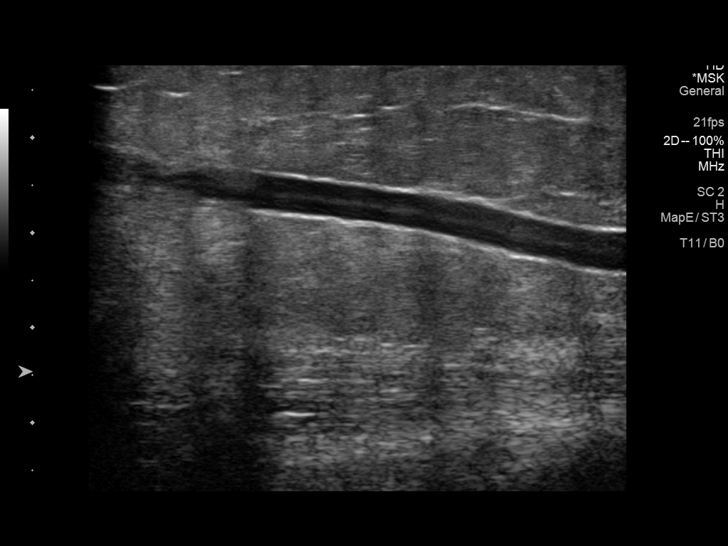
[im 14/15]
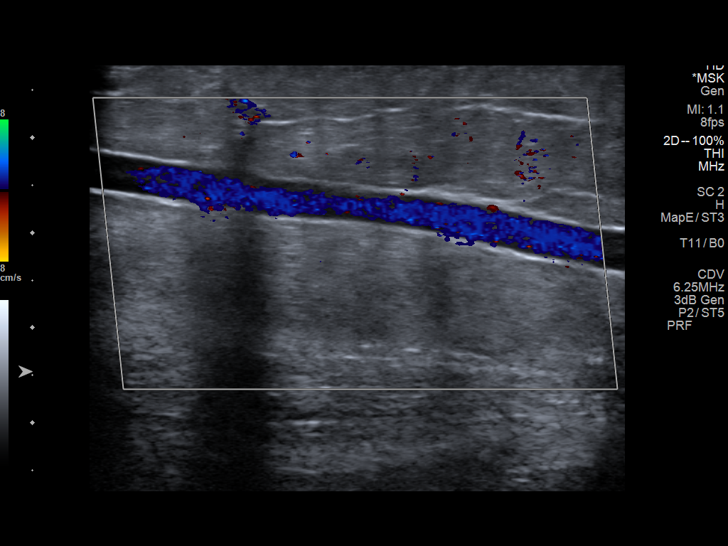
[im 15/15]
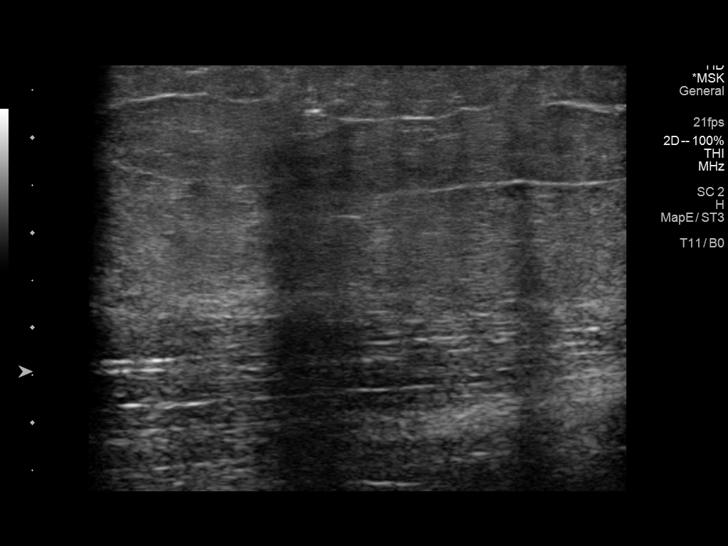

[14 of 15 positions shown; findings below may reference images not displayed]

FINDINGS: Targeted ultrasound of the area of swelling is performed. This is
designated as the left distal medial thigh. No cystic or solid mass
is visualized.
IMPRESSION: Negative. No sonographic correlate for left distal thigh swelling,
further management will need to be based on clinical grounds

## 2021-10-04 DIAGNOSIS — J386 Stenosis of larynx: Secondary | ICD-10-CM | POA: Diagnosis not present

## 2021-12-23 DIAGNOSIS — H43813 Vitreous degeneration, bilateral: Secondary | ICD-10-CM | POA: Diagnosis not present

## 2021-12-23 DIAGNOSIS — H18513 Endothelial corneal dystrophy, bilateral: Secondary | ICD-10-CM | POA: Diagnosis not present

## 2021-12-23 DIAGNOSIS — H5212 Myopia, left eye: Secondary | ICD-10-CM | POA: Diagnosis not present

## 2021-12-23 DIAGNOSIS — H2513 Age-related nuclear cataract, bilateral: Secondary | ICD-10-CM | POA: Diagnosis not present

## 2021-12-27 DIAGNOSIS — J386 Stenosis of larynx: Secondary | ICD-10-CM | POA: Diagnosis not present

## 2021-12-27 DIAGNOSIS — M25569 Pain in unspecified knee: Secondary | ICD-10-CM | POA: Diagnosis not present

## 2021-12-27 DIAGNOSIS — I8390 Asymptomatic varicose veins of unspecified lower extremity: Secondary | ICD-10-CM | POA: Diagnosis not present

## 2021-12-27 DIAGNOSIS — Z0000 Encounter for general adult medical examination without abnormal findings: Secondary | ICD-10-CM | POA: Diagnosis not present

## 2021-12-27 DIAGNOSIS — E785 Hyperlipidemia, unspecified: Secondary | ICD-10-CM | POA: Diagnosis not present

## 2022-01-11 DIAGNOSIS — M25561 Pain in right knee: Secondary | ICD-10-CM | POA: Diagnosis not present

## 2022-01-11 DIAGNOSIS — M25562 Pain in left knee: Secondary | ICD-10-CM | POA: Diagnosis not present

## 2022-01-18 ENCOUNTER — Other Ambulatory Visit: Payer: Self-pay | Admitting: Sports Medicine

## 2022-01-18 ENCOUNTER — Ambulatory Visit
Admission: RE | Admit: 2022-01-18 | Discharge: 2022-01-18 | Disposition: A | Discharge status: Home / Self Care | Payer: Medicare Other | Source: Ambulatory Visit | Attending: Sports Medicine | Admitting: Sports Medicine

## 2022-01-18 DIAGNOSIS — M25562 Pain in left knee: Secondary | ICD-10-CM

## 2022-01-18 DIAGNOSIS — M25561 Pain in right knee: Secondary | ICD-10-CM

## 2022-01-18 DIAGNOSIS — M1712 Unilateral primary osteoarthritis, left knee: Secondary | ICD-10-CM | POA: Diagnosis not present

## 2022-01-18 DIAGNOSIS — M1711 Unilateral primary osteoarthritis, right knee: Secondary | ICD-10-CM | POA: Diagnosis not present

## 2022-02-01 DIAGNOSIS — J398 Other specified diseases of upper respiratory tract: Secondary | ICD-10-CM | POA: Diagnosis not present

## 2022-02-01 DIAGNOSIS — J386 Stenosis of larynx: Secondary | ICD-10-CM | POA: Diagnosis not present

## 2022-04-12 ENCOUNTER — Ambulatory Visit (INDEPENDENT_AMBULATORY_CARE_PROVIDER_SITE_OTHER): Payer: Medicare Other | Admitting: Internal Medicine

## 2022-04-12 ENCOUNTER — Encounter: Payer: Self-pay | Admitting: Internal Medicine

## 2022-04-12 VITALS — BP 138/83 | HR 74 | Ht 60.0 in | Wt 176.0 lb

## 2022-04-12 DIAGNOSIS — E785 Hyperlipidemia, unspecified: Secondary | ICD-10-CM

## 2022-04-12 MED ORDER — EZETIMIBE 10 MG PO TABS: 10.0000 mg | ORAL_TABLET | Freq: Every day | ORAL | 3 refills | Status: AC

## 2022-04-12 NOTE — Progress Notes (Signed)
LIPID CLINIC CONSULT NOTE  Chief Complaint:  Manage dyslipidemia  Primary Care Physician: London Pepper, MD  Primary Cardiologist:  None  HPI:  Stefanie Hanson is a 76 y.o. female who is being seen today for the evaluation of dyslipidemia at the request of London Pepper, MD. this is a pleasant 76 year old female kindly referred for evaluation management of dyslipidemia.  She has a history of high cholesterol and recently had lipid testing in May 2023 showing total cholesterol 271, HDL 63, triglycerides 178 and LDL 176.  She had previously been on rosuvastatin but had side effects which she felt were related to that, namely she had hair loss.  She notes that after stopping it she had improvement in regrowth of her hair several months later.  She has been hesitant to try another statin.  She is not currently taking any medications.  She feels that if she were to lose weight that it would help her cholesterol although she has had difficulty with this over many years.  Her target LDL probably is less than 100 given few cardiovascular risk factors.  She denies any chest pain or worsening shortness of breath.  She did note that her father died of heart disease however he was 26.  PMHx:  No past medical history on file.  Past Surgical History:  Procedure Laterality Date   TUBAL LIGATION  1979    FAMHx:  Family History  Problem Relation Age of Onset   Coronary artery disease Father    Other Mother        intestinal blockage   Lung cancer Brother    Hypertension Sister    Hypertension Sister    Heart attack Father     SOCHx:   reports that she has never smoked. She does not have any smokeless tobacco history on file. No history on file for alcohol use and drug use.  ALLERGIES:  Allergies  Allergen Reactions   Rosuvastatin     Other reaction(s): Other (See Comments) Patient states causes hair loss    ROS: Pertinent items noted in HPI and remainder of comprehensive ROS  otherwise negative.  HOME MEDS: Current Outpatient Medications on File Prior to Visit  Medication Sig Dispense Refill   calcium carbonate (TUMS EX) 750 MG chewable tablet Chew by mouth.     Multiple Vitamin (MULTI-VITAMIN) tablet Take 1 tablet by mouth daily.     Omega-3 1000 MG CAPS Take by mouth.     No current facility-administered medications on file prior to visit.    LABS/IMAGING: No results found for this or any previous visit (from the past 48 hour(s)). No results found.  LIPID PANEL: No results found for: "CHOL", "TRIG", "HDL", "CHOLHDL", "VLDL", "LDLCALC", "LDLDIRECT"  WEIGHTS: Wt Readings from Last 3 Encounters:  04/12/22 176 lb (79.8 kg)  09/25/11 164 lb (74.4 kg)  08/07/11 173 lb 9.6 oz (78.7 kg)    VITALS: BP 138/83   Pulse 74   Ht 5' (1.524 m)   Wt 176 lb (79.8 kg)   SpO2 96%   BMI 34.37 kg/m   EXAM: Deferred  EKG: Deferred  ASSESSMENT: Mixed dyslipidemia, goal LDL less than 70 Hair loss possibly related to rosuvastatin  PLAN: 1.   Stefanie Hanson has a mixed dyslipidemia with high LDL cholesterol.  She is hesitant to try statins because she had some hair loss possibly related to rosuvastatin.  She feels like she could have better cholesterol with weight loss.  She wants to work  over the next 6 months to do that.  I advised possibly trying a nonstatin such as ezetimibe in addition to that.  She is agreeable.  Start 10 mg ezetimibe daily.  We will repeat lipids in 6 months and follow-up with me at that time.  Thanks for the kind referral.  Pixie Casino, MD, FACC, [place] Director of the [facility] Diplomate of the American Board of Clinical Lipidology Attending Cardiologist  Direct Dial: [phone]  Fax: [phone]  Website:  [email or web address] 04/12/2022, 4:33 PM

## 2022-04-12 NOTE — Patient Instructions (Signed)
Medication Instructions:  START zetia '10mg'$  daily  *If you need a refill on your cardiac medications before your next appointment, please call your pharmacy*   Lab Work: FASTING lab work to check cholesterol in about 6 months -- complete about one week before your next visit   If you have labs (blood work) drawn today and your tests are completely normal, you will receive your results only by: [place] (if you have MyChart) OR A paper copy in the mail If you have any lab test that is abnormal or we need to change your treatment, we will call you to review the results.  Your next appointment:   6 month(s)  The format for your next appointment:   In Person  Provider:   Lyman Bishop MD

## 2022-06-05 DIAGNOSIS — Z9889 Other specified postprocedural states: Secondary | ICD-10-CM | POA: Diagnosis not present

## 2022-06-05 DIAGNOSIS — J386 Stenosis of larynx: Secondary | ICD-10-CM | POA: Diagnosis not present

## 2022-08-10 DIAGNOSIS — J398 Other specified diseases of upper respiratory tract: Secondary | ICD-10-CM | POA: Diagnosis not present

## 2022-12-06 IMAGING — CR DG KNEE 1-2V*L*
2 series · 2 of 2 positions shown · non-contrast
Comparison: None Available.

CLINICAL DATA: Left knee pain

EXAM:
LEFT KNEE - 1-2 VIEW

[w knee ap left]
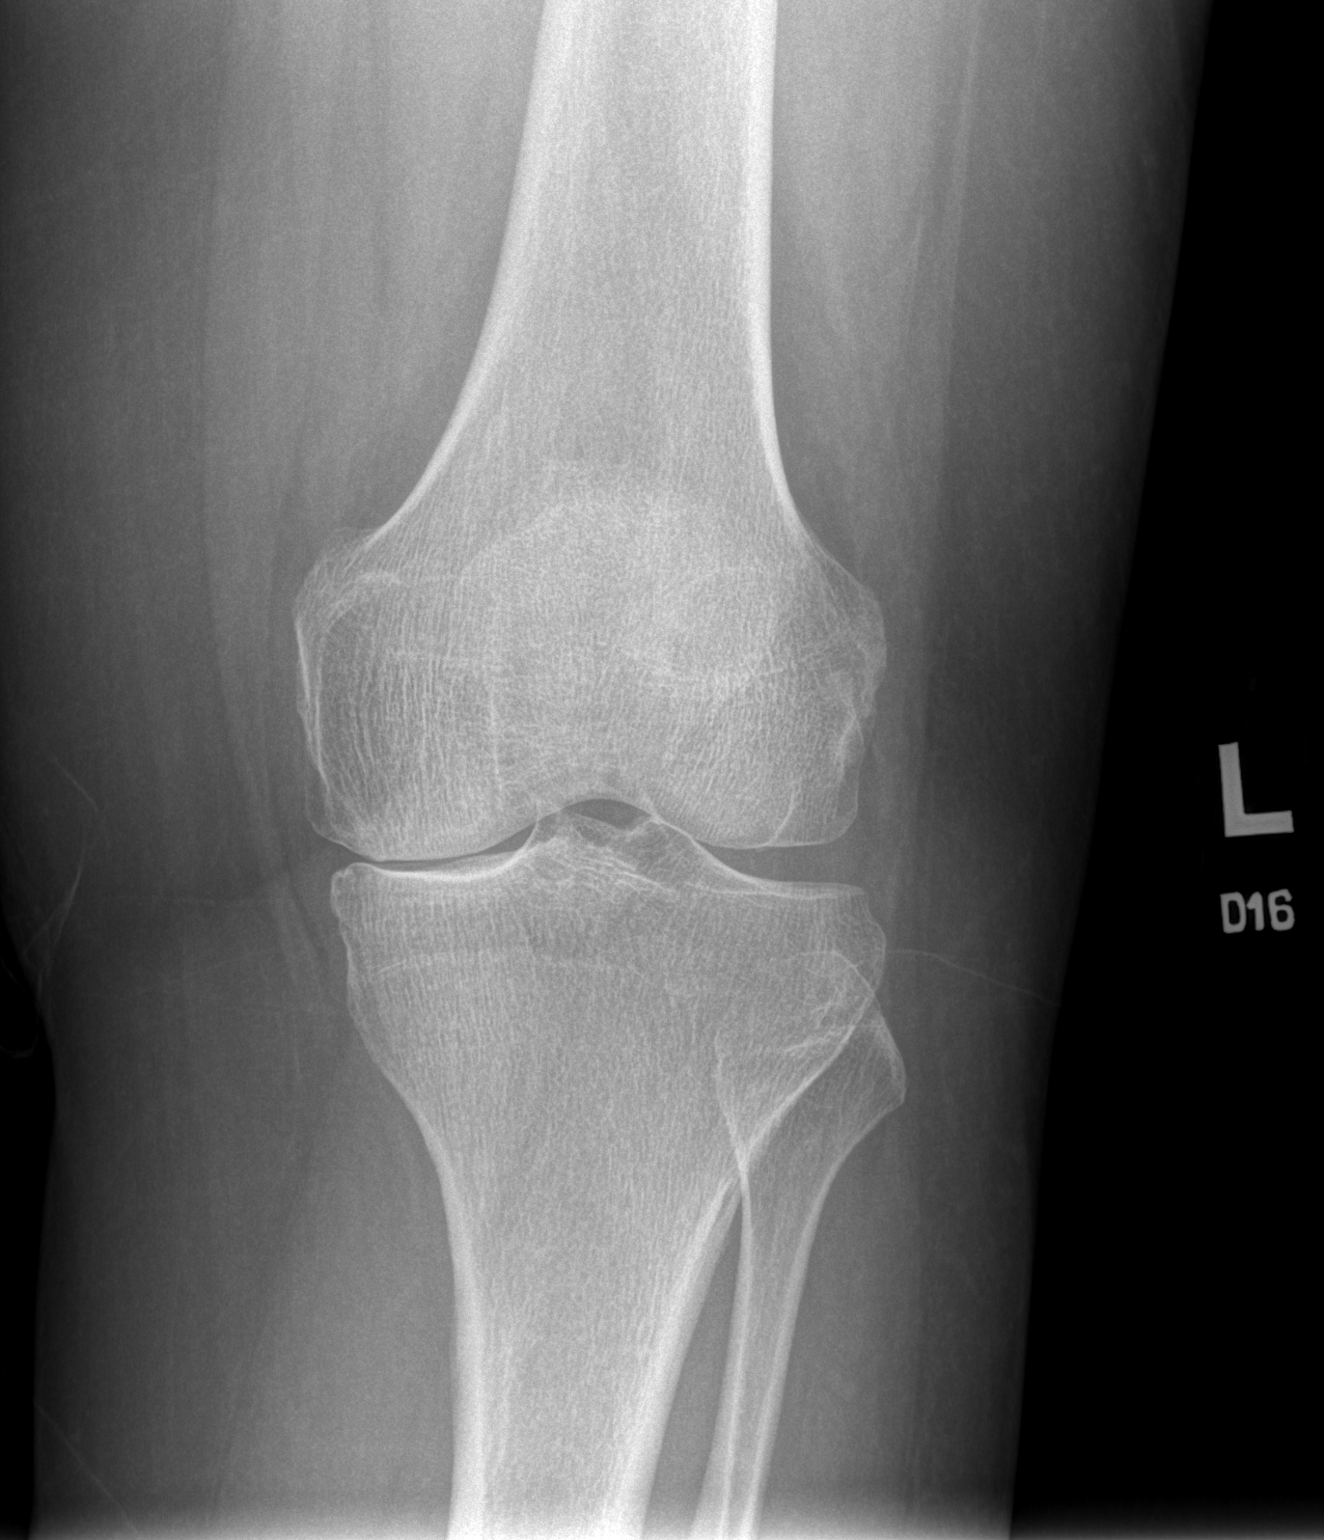

[w knee lat. left]
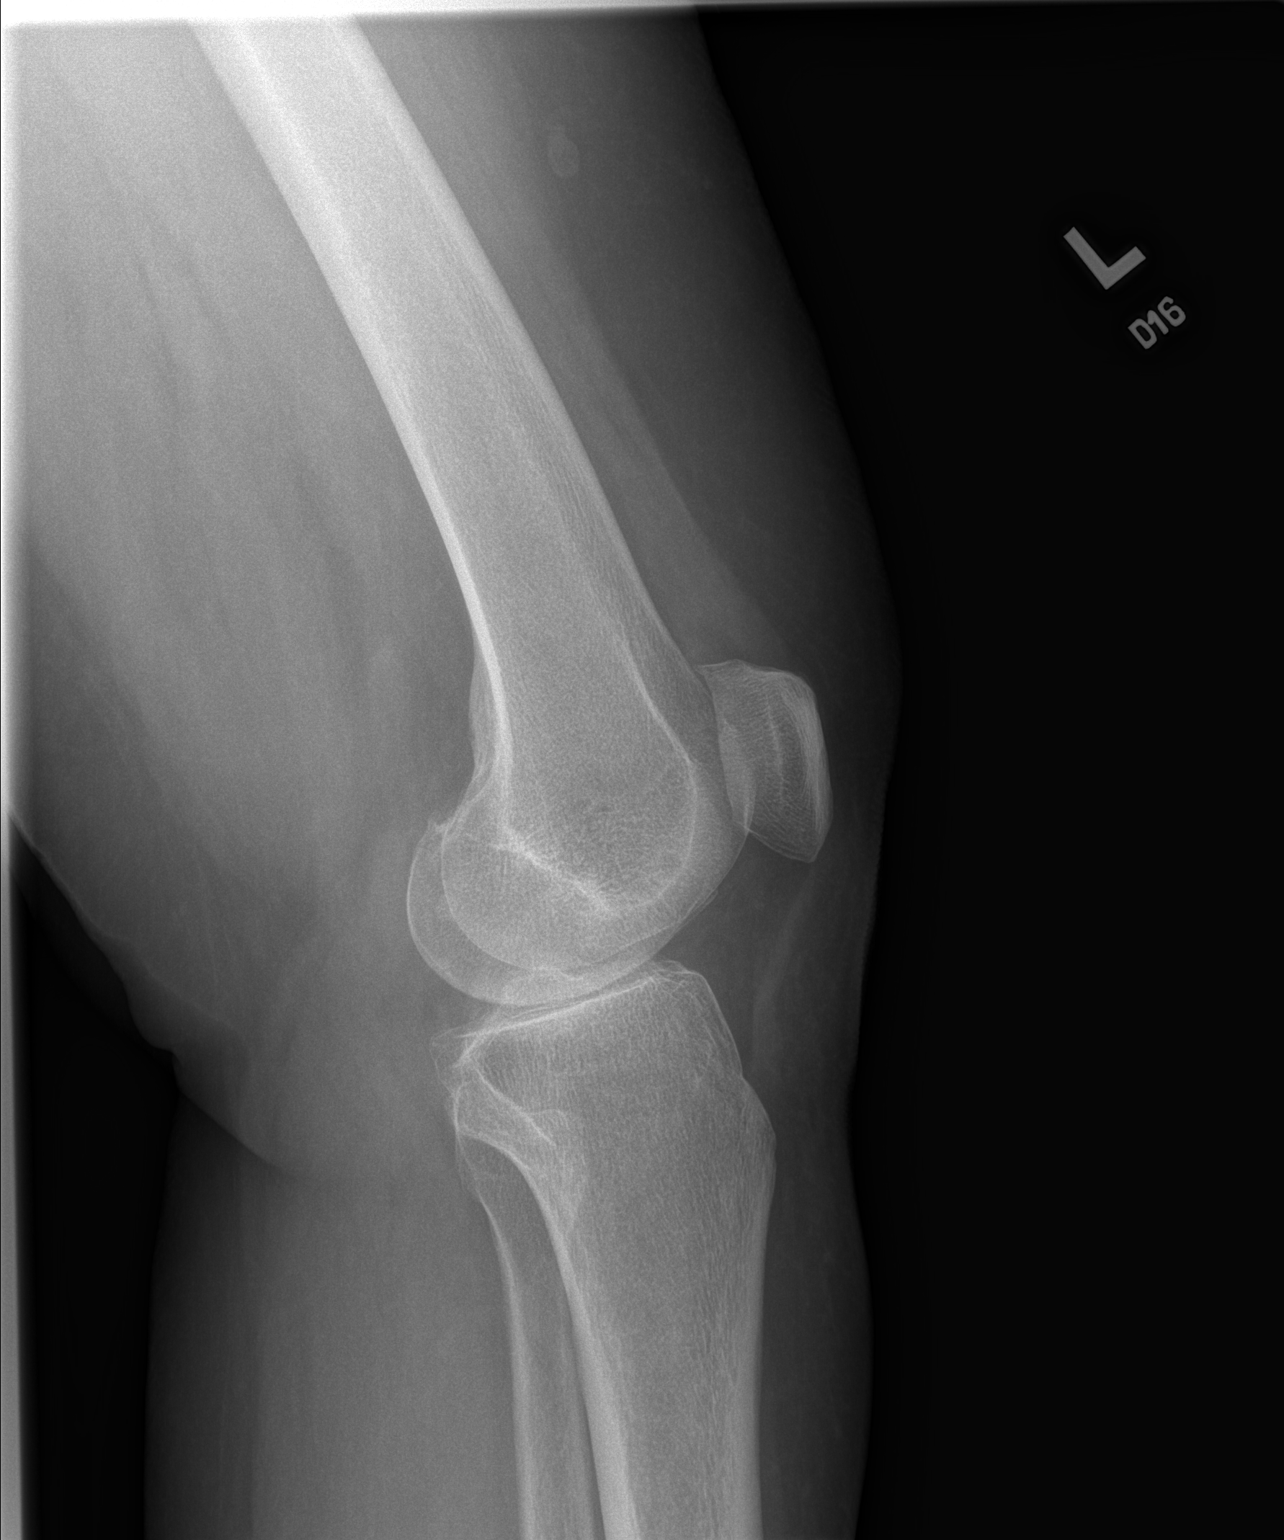

[2 of 2 positions shown; findings below may reference images not displayed]

FINDINGS: No acute fracture or dislocation identified. Moderate narrowing of
the medial compartment. Early marginal osteophytes. No significant
joint effusion visualized.
IMPRESSION: Degenerative changes with no acute osseous abnormality identified.

## 2022-12-06 IMAGING — CR DG KNEE 1-2V*R*
2 series · 2 of 2 positions shown · non-contrast
Comparison: None Available.

CLINICAL DATA: Right knee pain

EXAM:
RIGHT KNEE - 1-2 VIEW

[w knee ap right]
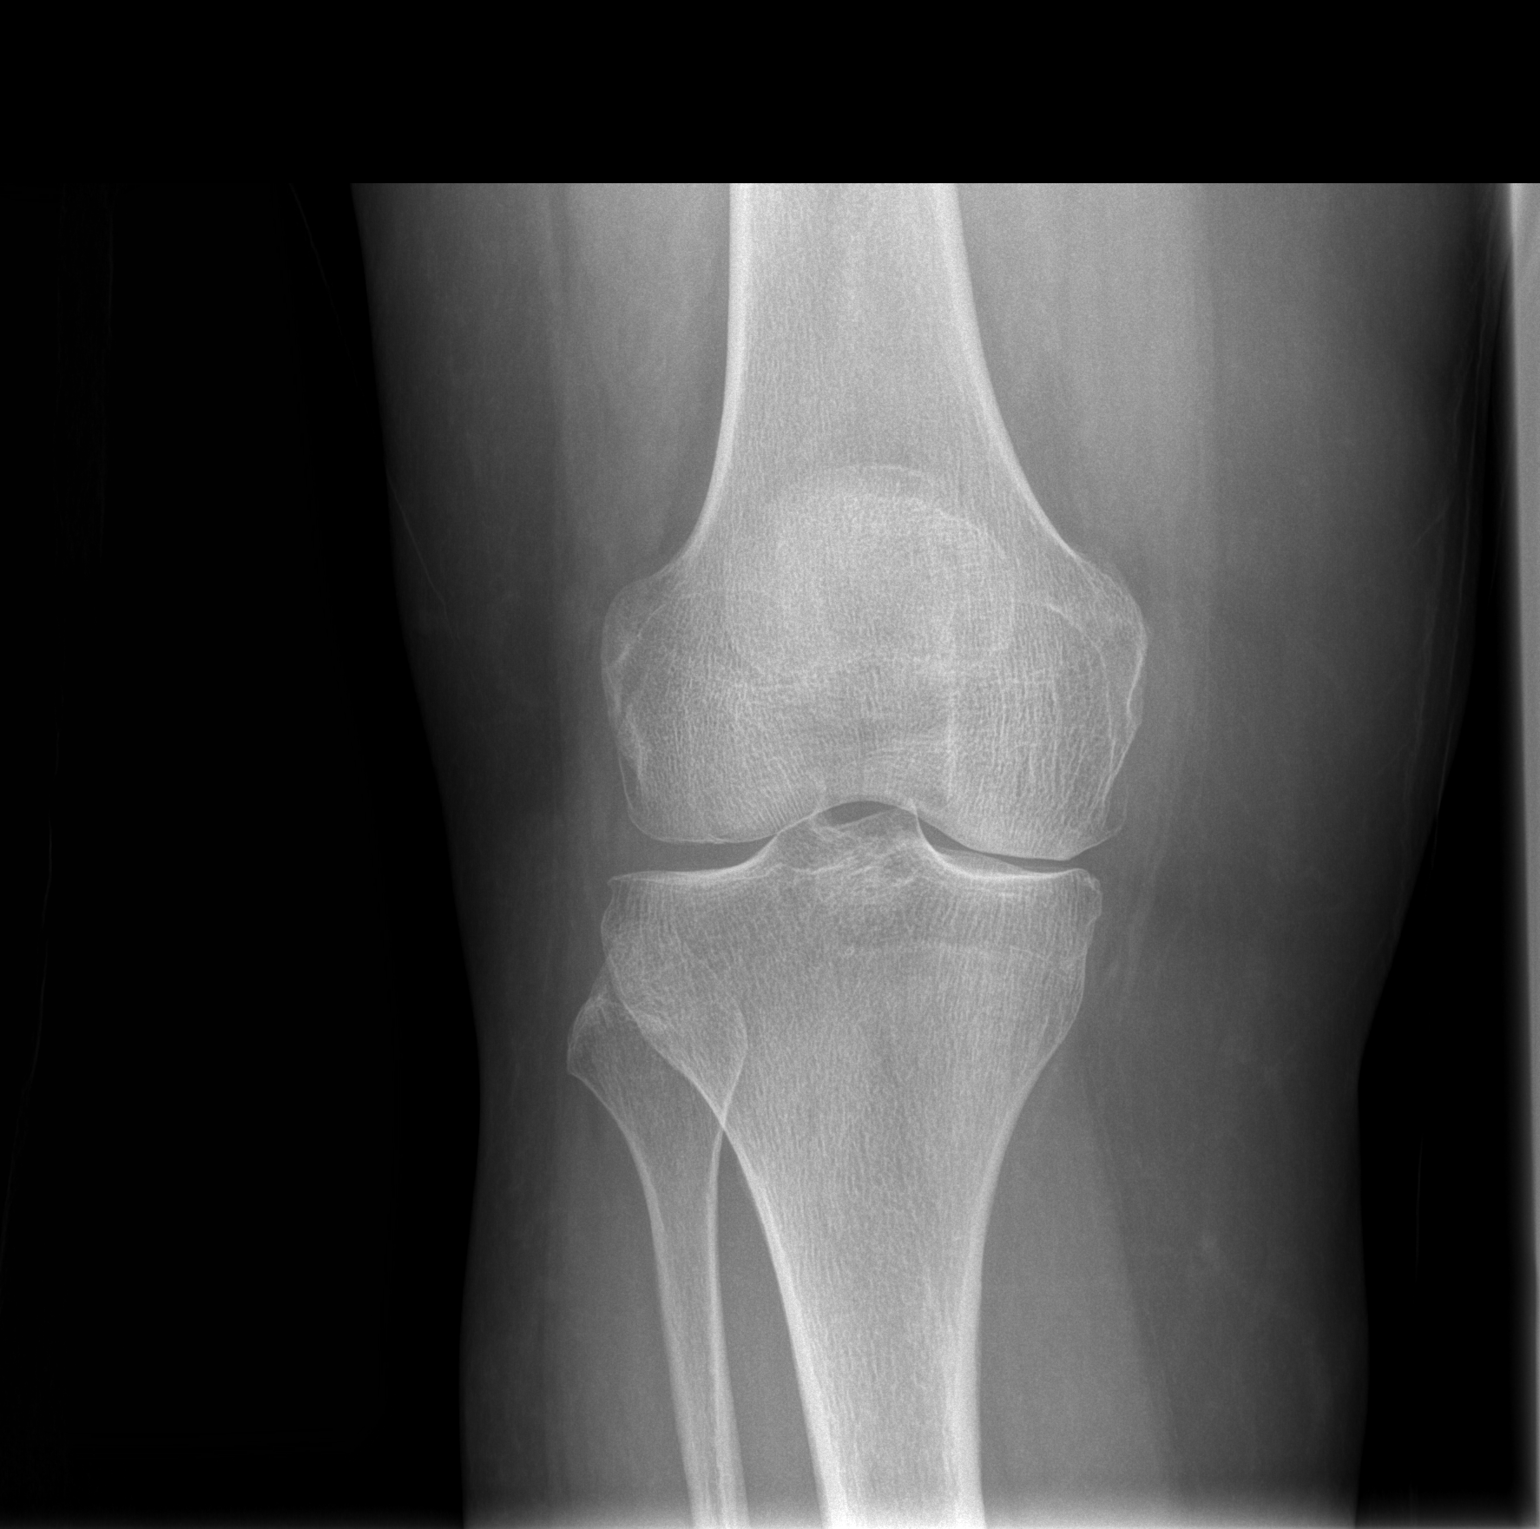

[w knee lat. right]
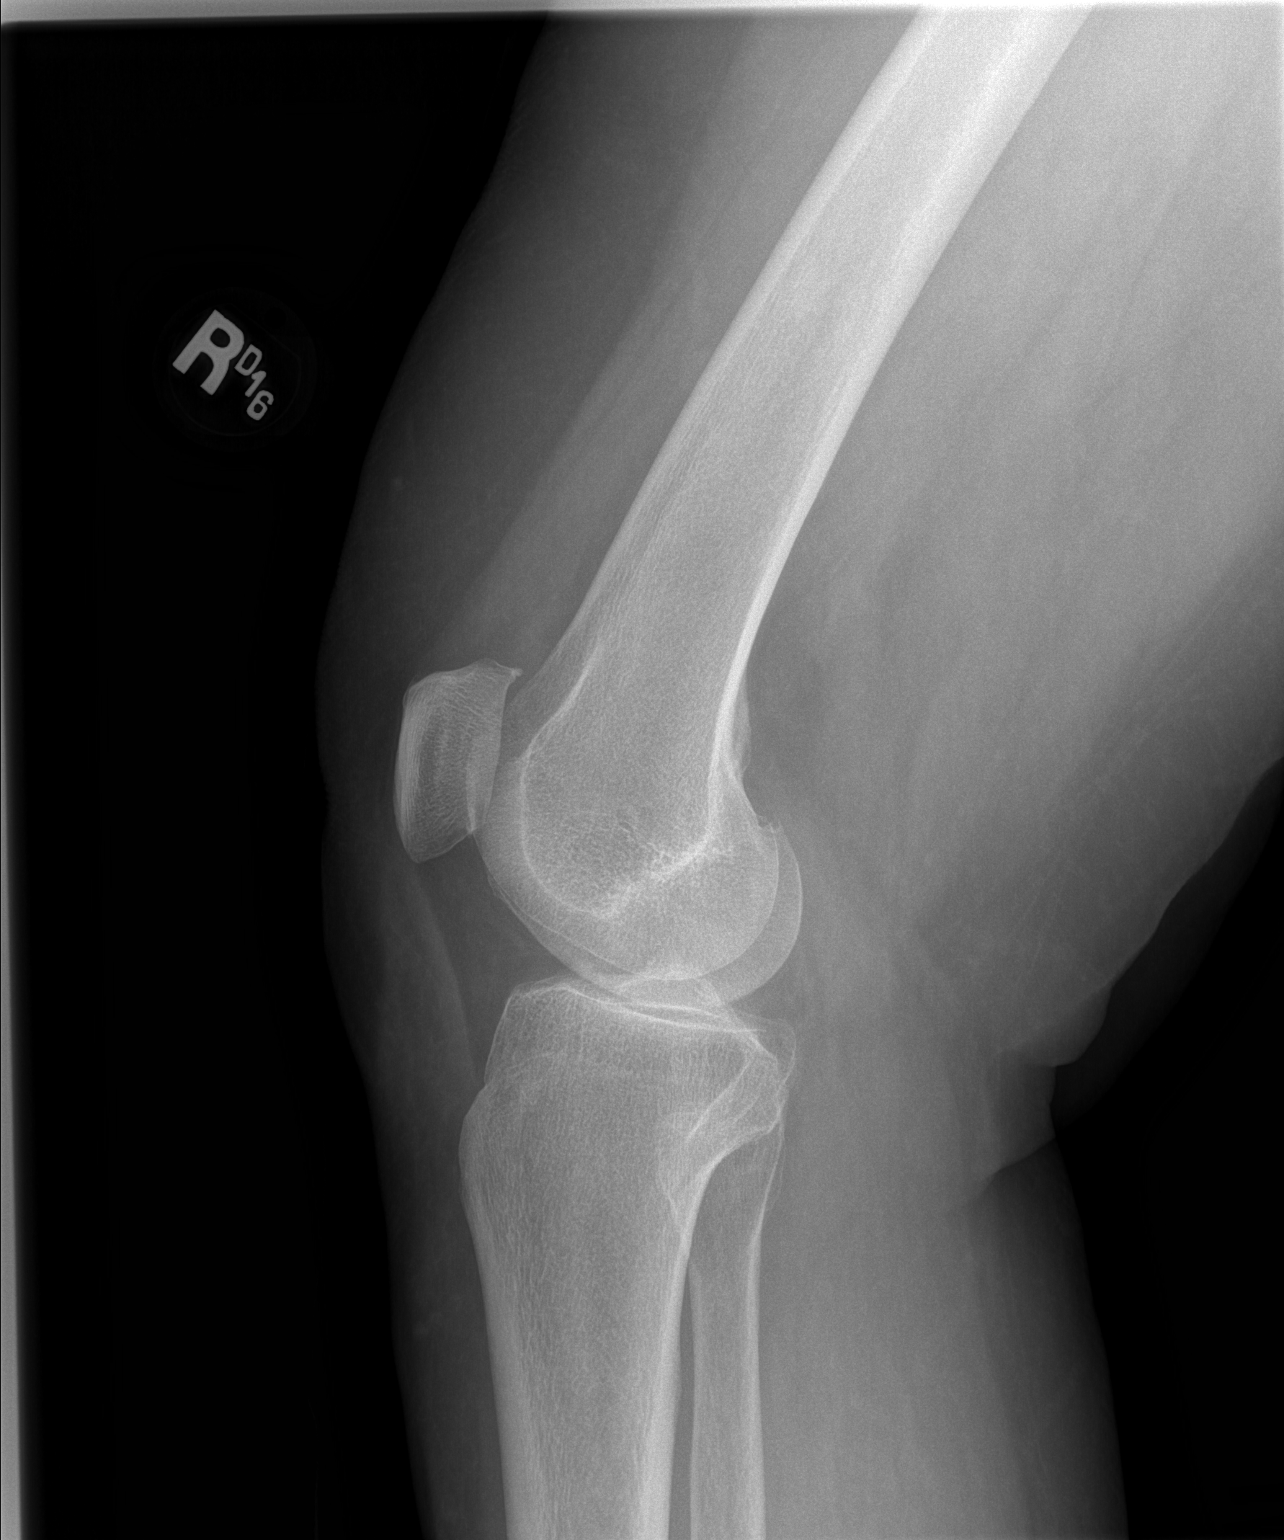

[2 of 2 positions shown; findings below may reference images not displayed]

FINDINGS: No acute fracture or dislocation identified. Moderate narrowing of
the medial compartment joint space. Small tricompartmental
osteophytes. No significant joint effusion visualized.
IMPRESSION: Degenerative changes with no acute osseous abnormality identified.

## 2023-01-09 DIAGNOSIS — H25013 Cortical age-related cataract, bilateral: Secondary | ICD-10-CM | POA: Diagnosis not present

## 2023-01-09 DIAGNOSIS — H43813 Vitreous degeneration, bilateral: Secondary | ICD-10-CM | POA: Diagnosis not present

## 2023-01-09 DIAGNOSIS — H18513 Endothelial corneal dystrophy, bilateral: Secondary | ICD-10-CM | POA: Diagnosis not present

## 2023-01-09 DIAGNOSIS — H52203 Unspecified astigmatism, bilateral: Secondary | ICD-10-CM | POA: Diagnosis not present

## 2023-01-09 DIAGNOSIS — H2513 Age-related nuclear cataract, bilateral: Secondary | ICD-10-CM | POA: Diagnosis not present

## 2023-01-09 DIAGNOSIS — H524 Presbyopia: Secondary | ICD-10-CM | POA: Diagnosis not present

## 2023-04-18 DIAGNOSIS — Z0000 Encounter for general adult medical examination without abnormal findings: Secondary | ICD-10-CM | POA: Diagnosis not present

## 2023-04-18 DIAGNOSIS — Z131 Encounter for screening for diabetes mellitus: Secondary | ICD-10-CM | POA: Diagnosis not present

## 2023-04-18 DIAGNOSIS — J386 Stenosis of larynx: Secondary | ICD-10-CM | POA: Diagnosis not present

## 2023-04-18 DIAGNOSIS — E785 Hyperlipidemia, unspecified: Secondary | ICD-10-CM | POA: Diagnosis not present

## 2024-01-15 DIAGNOSIS — H18513 Endothelial corneal dystrophy, bilateral: Secondary | ICD-10-CM | POA: Diagnosis not present

## 2024-01-15 DIAGNOSIS — H524 Presbyopia: Secondary | ICD-10-CM | POA: Diagnosis not present

## 2024-01-15 DIAGNOSIS — H25013 Cortical age-related cataract, bilateral: Secondary | ICD-10-CM | POA: Diagnosis not present

## 2024-01-15 DIAGNOSIS — H2513 Age-related nuclear cataract, bilateral: Secondary | ICD-10-CM | POA: Diagnosis not present

## 2024-01-15 DIAGNOSIS — H43813 Vitreous degeneration, bilateral: Secondary | ICD-10-CM | POA: Diagnosis not present

## 2024-01-15 DIAGNOSIS — H52203 Unspecified astigmatism, bilateral: Secondary | ICD-10-CM | POA: Diagnosis not present

## 2024-04-24 DIAGNOSIS — Z0000 Encounter for general adult medical examination without abnormal findings: Secondary | ICD-10-CM | POA: Diagnosis not present

## 2024-04-24 DIAGNOSIS — Z131 Encounter for screening for diabetes mellitus: Secondary | ICD-10-CM | POA: Diagnosis not present

## 2024-04-24 DIAGNOSIS — E785 Hyperlipidemia, unspecified: Secondary | ICD-10-CM | POA: Diagnosis not present

## 2024-04-24 DIAGNOSIS — J386 Stenosis of larynx: Secondary | ICD-10-CM | POA: Diagnosis not present

## 2024-07-31 DIAGNOSIS — E785 Hyperlipidemia, unspecified: Secondary | ICD-10-CM | POA: Diagnosis not present
